# Patient Record
Sex: Female | Born: 1935 | Race: White | Hispanic: No | Marital: Married | Smoking: Never smoker
Health system: Southern US, Community
[De-identification: ages and names within clinical notes are randomized; demographics above are authoritative.]

## PROBLEM LIST (undated history)

## (undated) DIAGNOSIS — H269 Unspecified cataract: Secondary | ICD-10-CM

## (undated) DIAGNOSIS — R002 Palpitations: Secondary | ICD-10-CM

## (undated) DIAGNOSIS — K219 Gastro-esophageal reflux disease without esophagitis: Secondary | ICD-10-CM

## (undated) DIAGNOSIS — F0280 Dementia in other diseases classified elsewhere without behavioral disturbance: Secondary | ICD-10-CM

## (undated) DIAGNOSIS — R5383 Other fatigue: Secondary | ICD-10-CM

## (undated) DIAGNOSIS — N63 Unspecified lump in unspecified breast: Secondary | ICD-10-CM

## (undated) DIAGNOSIS — J309 Allergic rhinitis, unspecified: Secondary | ICD-10-CM

## (undated) DIAGNOSIS — I10 Essential (primary) hypertension: Secondary | ICD-10-CM

## (undated) DIAGNOSIS — F419 Anxiety disorder, unspecified: Secondary | ICD-10-CM

## (undated) DIAGNOSIS — M8580 Other specified disorders of bone density and structure, unspecified site: Secondary | ICD-10-CM

## (undated) DIAGNOSIS — R42 Dizziness and giddiness: Secondary | ICD-10-CM

## (undated) DIAGNOSIS — E1142 Type 2 diabetes mellitus with diabetic polyneuropathy: Secondary | ICD-10-CM

## (undated) DIAGNOSIS — M545 Low back pain, unspecified: Secondary | ICD-10-CM

## (undated) DIAGNOSIS — K589 Irritable bowel syndrome without diarrhea: Secondary | ICD-10-CM

## (undated) DIAGNOSIS — L292 Pruritus vulvae: Secondary | ICD-10-CM

## (undated) DIAGNOSIS — E7800 Pure hypercholesterolemia, unspecified: Secondary | ICD-10-CM

## (undated) DIAGNOSIS — E119 Type 2 diabetes mellitus without complications: Secondary | ICD-10-CM

## (undated) DIAGNOSIS — E782 Mixed hyperlipidemia: Secondary | ICD-10-CM

## (undated) DIAGNOSIS — M1990 Unspecified osteoarthritis, unspecified site: Secondary | ICD-10-CM

## (undated) DIAGNOSIS — R339 Retention of urine, unspecified: Secondary | ICD-10-CM

## (undated) DIAGNOSIS — G309 Alzheimer's disease, unspecified: Secondary | ICD-10-CM

## (undated) DIAGNOSIS — F410 Panic disorder [episodic paroxysmal anxiety] without agoraphobia: Secondary | ICD-10-CM

## (undated) HISTORY — DX: Mixed hyperlipidemia: E78.2

## (undated) HISTORY — DX: Essential (primary) hypertension: I10

## (undated) HISTORY — DX: Dementia in other diseases classified elsewhere, unspecified severity, without behavioral disturbance, psychotic disturbance, mood disturbance, and anxiety: F02.80

## (undated) HISTORY — DX: Other specified disorders of bone density and structure, unspecified site: M85.80

## (undated) HISTORY — DX: Retention of urine, unspecified: R33.9

## (undated) HISTORY — DX: Low back pain: M54.5

## (undated) HISTORY — DX: Unspecified lump in unspecified breast: N63.0

## (undated) HISTORY — DX: Low back pain, unspecified: M54.50

## (undated) HISTORY — DX: Palpitations: R00.2

## (undated) HISTORY — DX: Panic disorder (episodic paroxysmal anxiety): F41.0

## (undated) HISTORY — DX: Unspecified osteoarthritis, unspecified site: M19.90

## (undated) HISTORY — DX: Type 2 diabetes mellitus with diabetic polyneuropathy: E11.42

## (undated) HISTORY — DX: Gastro-esophageal reflux disease without esophagitis: K21.9

## (undated) HISTORY — DX: Pruritus vulvae: L29.2

## (undated) HISTORY — DX: Unspecified cataract: H26.9

## (undated) HISTORY — DX: Irritable bowel syndrome without diarrhea: K58.9

## (undated) HISTORY — DX: Dizziness and giddiness: R42

## (undated) HISTORY — DX: Pure hypercholesterolemia, unspecified: E78.00

## (undated) HISTORY — DX: Allergic rhinitis, unspecified: J30.9

## (undated) HISTORY — DX: Other fatigue: R53.83

## (undated) HISTORY — DX: Alzheimer's disease, unspecified: G30.9

## (undated) HISTORY — PX: TOTAL HIP ARTHROPLASTY: SHX124

## (undated) HISTORY — DX: Irritable bowel syndrome, unspecified: K58.9

## (undated) HISTORY — DX: Anxiety disorder, unspecified: F41.9

## (undated) HISTORY — DX: Type 2 diabetes mellitus without complications: E11.9

---

## 2003-08-23 ENCOUNTER — Inpatient Hospital Stay (HOSPITAL_COMMUNITY): Admission: AD | Admit: 2003-08-23 | Discharge: 2003-08-31 | Payer: Self-pay | Admitting: Cardiology

## 2009-12-29 ENCOUNTER — Inpatient Hospital Stay (HOSPITAL_COMMUNITY): Admission: AD | Admit: 2009-12-29 | Discharge: 2010-01-04 | Payer: Self-pay | Admitting: Internal Medicine

## 2010-07-27 LAB — CBC
HCT: 25.3 % — ABNORMAL LOW (ref 36.0–46.0)
HCT: 26.2 % — ABNORMAL LOW (ref 36.0–46.0)
HCT: 26.7 % — ABNORMAL LOW (ref 36.0–46.0)
Hemoglobin: 9 g/dL — ABNORMAL LOW (ref 12.0–15.0)
Hemoglobin: 9.1 g/dL — ABNORMAL LOW (ref 12.0–15.0)
MCH: 28.5 pg (ref 26.0–34.0)
MCH: 28.6 pg (ref 26.0–34.0)
MCH: 28.9 pg (ref 26.0–34.0)
MCH: 28.9 pg (ref 26.0–34.0)
MCHC: 33.3 g/dL (ref 30.0–36.0)
MCHC: 33.4 g/dL (ref 30.0–36.0)
MCV: 85.5 fL (ref 78.0–100.0)
MCV: 85.6 fL (ref 78.0–100.0)
MCV: 87.3 fL (ref 78.0–100.0)
Platelets: 149 10*3/uL — ABNORMAL LOW (ref 150–400)
Platelets: 171 10*3/uL (ref 150–400)
Platelets: 201 10*3/uL (ref 150–400)
RBC: 3 MIL/uL — ABNORMAL LOW (ref 3.87–5.11)
RBC: 3.12 MIL/uL — ABNORMAL LOW (ref 3.87–5.11)
RBC: 3.2 MIL/uL — ABNORMAL LOW (ref 3.87–5.11)
RDW: 14.2 % (ref 11.5–15.5)
RDW: 14.4 % (ref 11.5–15.5)
RDW: 14.8 % (ref 11.5–15.5)
WBC: 10.4 10*3/uL (ref 4.0–10.5)
WBC: 11.2 10*3/uL — ABNORMAL HIGH (ref 4.0–10.5)
WBC: 7.5 10*3/uL (ref 4.0–10.5)

## 2010-07-27 LAB — CROSSMATCH: Antibody Screen: NEGATIVE

## 2010-07-27 LAB — BASIC METABOLIC PANEL
BUN: 12 mg/dL (ref 6–23)
BUN: 17 mg/dL (ref 6–23)
BUN: 19 mg/dL (ref 6–23)
CO2: 25 mEq/L (ref 19–32)
CO2: 29 mEq/L (ref 19–32)
Calcium: 8 mg/dL — ABNORMAL LOW (ref 8.4–10.5)
Calcium: 8.6 mg/dL (ref 8.4–10.5)
Chloride: 100 mEq/L (ref 96–112)
Chloride: 103 mEq/L (ref 96–112)
Chloride: 105 mEq/L (ref 96–112)
Creatinine, Ser: 0.85 mg/dL (ref 0.4–1.2)
Creatinine, Ser: 0.87 mg/dL (ref 0.4–1.2)
GFR calc Af Amer: >60 mL/min (ref >60)
GFR calc non Af Amer: >60 mL/min (ref >60)
Glucose, Bld: 207 mg/dL — ABNORMAL HIGH (ref 70–99)
Glucose, Bld: 267 mg/dL — ABNORMAL HIGH (ref 70–99)
Potassium: 3.7 mEq/L (ref 3.5–5.1)
Potassium: 3.7 mEq/L (ref 3.5–5.1)
Potassium: 3.8 mEq/L (ref 3.5–5.1)
Sodium: 134 mEq/L — ABNORMAL LOW (ref 135–145)

## 2010-07-27 LAB — GLUCOSE, CAPILLARY
Glucose-Capillary: 124 mg/dL — ABNORMAL HIGH (ref 70–99)
Glucose-Capillary: 133 mg/dL — ABNORMAL HIGH (ref 70–99)
Glucose-Capillary: 150 mg/dL — ABNORMAL HIGH (ref 70–99)
Glucose-Capillary: 159 mg/dL — ABNORMAL HIGH (ref 70–99)
Glucose-Capillary: 164 mg/dL — ABNORMAL HIGH (ref 70–99)
Glucose-Capillary: 176 mg/dL — ABNORMAL HIGH (ref 70–99)
Glucose-Capillary: 184 mg/dL — ABNORMAL HIGH (ref 70–99)
Glucose-Capillary: 198 mg/dL — ABNORMAL HIGH (ref 70–99)
Glucose-Capillary: 215 mg/dL — ABNORMAL HIGH (ref 70–99)
Glucose-Capillary: 218 mg/dL — ABNORMAL HIGH (ref 70–99)
Glucose-Capillary: 219 mg/dL — ABNORMAL HIGH (ref 70–99)
Glucose-Capillary: 252 mg/dL — ABNORMAL HIGH (ref 70–99)
Glucose-Capillary: 265 mg/dL — ABNORMAL HIGH (ref 70–99)

## 2010-07-27 LAB — DIFFERENTIAL
Basophils Absolute: 0 10*3/uL (ref 0.0–0.1)
Basophils Absolute: 0 10*3/uL (ref 0.0–0.1)
Basophils Relative: 0 % (ref 0–1)
Eosinophils Absolute: 0.2 10*3/uL (ref 0.0–0.7)
Eosinophils Relative: 2 % (ref 0–5)
Lymphocytes Relative: 17 % (ref 12–46)
Lymphs Abs: 1.6 10*3/uL (ref 0.7–4.0)
Monocytes Absolute: 0.7 10*3/uL (ref 0.1–1.0)
Monocytes Relative: 6 % (ref 3–12)
Neutro Abs: 9.5 10*3/uL — ABNORMAL HIGH (ref 1.7–7.7)
Neutrophils Relative %: 76 % (ref 43–77)

## 2010-07-27 LAB — COMPREHENSIVE METABOLIC PANEL
Alkaline Phosphatase: 52 U/L (ref 39–117)
BUN: 21 mg/dL (ref 6–23)
CO2: 26 mEq/L (ref 19–32)
CO2: 26 mEq/L (ref 19–32)
Calcium: 8.5 mg/dL (ref 8.4–10.5)
Chloride: 101 mEq/L (ref 96–112)
Chloride: 101 mEq/L (ref 96–112)
Creatinine, Ser: 1.01 mg/dL (ref 0.4–1.2)
GFR calc Af Amer: >60 mL/min (ref >60)
GFR calc non Af Amer: 53 mL/min — ABNORMAL LOW (ref >60)
Glucose, Bld: 194 mg/dL — ABNORMAL HIGH (ref 70–99)
Glucose, Bld: 263 mg/dL — ABNORMAL HIGH (ref 70–99)
Potassium: 3.7 mEq/L (ref 3.5–5.1)
Total Bilirubin: 0.7 mg/dL (ref 0.3–1.2)
Total Bilirubin: 0.8 mg/dL (ref 0.3–1.2)

## 2010-07-27 LAB — URINE MICROSCOPIC-ADD ON

## 2010-07-27 LAB — URINALYSIS, MICROSCOPIC ONLY
Glucose, UA: 250 mg/dL — AB
Ketones, ur: NEGATIVE mg/dL
Leukocytes, UA: NEGATIVE
Protein, ur: 30 mg/dL — AB
Urobilinogen, UA: 0.2 mg/dL (ref 0.0–1.0)

## 2010-07-27 LAB — TSH: TSH: 0.772 u[IU]/mL (ref 0.350–4.500)

## 2010-07-27 LAB — CULTURE, BLOOD (ROUTINE X 2)

## 2010-07-27 LAB — URINALYSIS, ROUTINE W REFLEX MICROSCOPIC
Leukocytes, UA: NEGATIVE
Nitrite: NEGATIVE
Protein, ur: 30 mg/dL — AB

## 2010-07-27 LAB — HEMOGLOBIN A1C: Hgb A1c MFr Bld: 6.6 % — ABNORMAL HIGH (ref <5.7)

## 2010-07-27 LAB — PROTIME-INR: Prothrombin Time: 14.5 seconds (ref 11.6–15.2)

## 2010-07-27 LAB — ABO/RH: ABO/RH(D): A POS

## 2010-07-27 LAB — APTT: aPTT: 29 seconds (ref 24–37)

## 2010-09-28 NOTE — Consult Note (Signed)
NAME:  Gabriela Rojas, Gabriela Rojas                        ACCOUNT NO.:  [id number]   MEDICAL RECORD NO.:  [id number]                   PATIENT TYPE:  INP   LOCATION:  3703                                 FACILITY:  [facility]   PHYSICIAN:  Vania Rea. Supple, M.D.               DATE OF BIRTH:  21-Sep-1927   DATE OF CONSULTATION:  08/23/2003  DATE OF DISCHARGE:                                   CONSULTATION   HISTORY:  Gabriela Rojas is a 77 year old female who was transferred to [facility] from [facility] after falling back on April 5 and  sustaining a displaced left femoral neck fracture. Subsequent to her  admission at [facility] she was found to have complete  cardiopulmonary collapse necessitating intubation and a stay in the  intensive care unit. The possible etiologies of the cardiopulmonary collapse  included as a pulmonary embolus versus massive MI. However, workup does not  confirm the presence of PE or severe MI. Subsequently cardiac  catheterization performed at [facility] today did show some severe  stenosis of the small diagonal artery, but it was not felt that this  explained the hypotensive episode that she experienced. It was felt that she  was a reasonable surgical candidate based on the results of the  catheterization. At this point orthopedics has been consulted for  recommendations regarding management of her left hip fracture.   PAST MEDICAL HISTORY:  Gabriela Rojas' medical history if significant for  coronary artery disease, CHF, type 2 diabetes, peripheral neuropathy. I know  she does have a history of paroxysmal supraventricular tachycardias.   MEDICATIONS:  1. Lipitor.  2. Diovan.  3. Daily aspirin.  4. Glucophage.  5. Another antihypertensive medication, which she does not recall the name.   ALLERGIES:  Penicillin, ACE inhibitors, and sulfa drugs.   SOCIAL HISTORY:  Reports no tobacco or alcohol use. She is living  independently with her  husband.   PAST SURGICAL HISTORY:  Excision of a breast mass and bilateral cataracts.  She has had a D&C as well.   PHYSICAL EXAMINATION:  GENERAL:  Gabriela Rojas is a pleasant, cooperative  elderly female who is alert and oriented in no obvious acute distress. She  is cooperative with the exam. Examination limited to the left lower  extremity.  EXTREMITY:  Tenderness with palpation of the left hip and severe discomfort  with intensive hip motion. She has excellent digital motion distally in the  left lower extremity with a 2+ dorsal pedal pulse. She does describe some  diminished sensation secondary to underlying neuropathy and this is a  bilateral finding. Upper extremities reveal no acute bone or joint  instabilities.   RADIOGRAPHS:  No plain films are available, but these have been ordered. By  report she does have a displaced left femoral neck fracture.   IMPRESSION:  1. Reported left displaced femoral neck fracture.  2. Recent cardiopulmonary  collapse.  3. Type 2 diabetes.  4. Neuropathy.  5. History of supraventricular tachycardia.  6. Hypertension.   PLAN:  I have discussed with Ms. Biddy today's findings and treatment  options for the management of her left hip fracture. We have discussed the  technical details of hemiarthroplasty and risks versus benefits, possible  complications including neurovascular injury, leg-limb discrepancy,  dislocation of the implant, persistent pain, and possibly revision surgery  were reviewed. In addition, we have also discussed her potential increased  risk for  perioperative morbidity and/or mortality in relation to her  underlying problems. At this point we will await formal medical clearance  prior to surgery. Should appropriate clearances be available I would  anticipate surgery later this week. We have also discussed the anticipated  postoperative recovery  period. We will follow up tomorrow to confirm  medical clearance and make  formal arrangements for surgery.                                               Vania Rea. Supple, M.D.    KMS/MEDQ  D:  08/23/2003  T:  08/24/2003  Job:  [id number]

## 2010-09-28 NOTE — Cardiovascular Report (Signed)
NAME:  Gabriela Rojas, Gabriela Rojas                        ACCOUNT NO.:  [id number]   MEDICAL RECORD NO.:  [id number]                   PATIENT TYPE:  INP   LOCATION:  3703                                 FACILITY:  [facility]   PHYSICIAN:  Salvadore Farber, M.D. [facility]         DATE OF BIRTH:  1928/01/20   DATE OF PROCEDURE:  DATE OF DISCHARGE:                              CARDIAC CATHETERIZATION   PROCEDURE:  Left-heart catheterization, left ventriculography, coronary  angiography.   INDICATION:  [name] is a 77 year old lady without prior history of  cardiovascular disease but multiple cardiac risk factors.  She was admitted  to [facility] on April 5, after sustaining a fall and fracture of  the left hip.  On April 7, she had an episode of fever and profound  hypotension.  She was thereafter found to have a modest increase in her  cardiac enzymes.  Evaluation for pulmonary embolus with spiral CT was  unremarkable.  She has subsequently recovered.  She is now referred for  diagnostic angiography to exclude severe, underlying coronary disease as the  potential explanation for this event, and to provide risk stratification for  potential repair of her hip.   PROCEDURAL TECHNIQUE:  Informed consent was obtained.  Under 1% lidocaine  local anesthesia, a 6-French sheath was placed in the right common femoral  artery using the modified Seldinger technique.  Diagnostic angiography and  ventriculography were performed using JL-4, JR-4, and pigtail catheters.  The patient tolerated the procedure well, and was transferred to the holding  room in stable condition.  She has already moved there.   COMPLICATIONS:  None.   FINDINGS:  1. LV:  146/14/21.  Ejection fraction 65% without regional wall motion     abnormality. No aortic stenosis or mitral regurgitation.  2. LEFT MAIN:  Angiographically normal.  3. LAD.  The LAD is a moderate-sized vessel giving rise to a large first and     very-small  second diagonal.  There is a 60% stenosis of the mid portion     of the LAD.  The second diagonal has a 90% ostial stenosis. Again, this     is a very-small vessel.  4. CIRCUMFLEX:  A moderate-sized vessel giving rise to a single obtuse     marginal.  There are only minor luminal irregularities along its course.  5. RCA:  A large, dominant vessel.  There is a 20% stenosis of the proximal     vessel.   IMPRESSION/RECOMMENDATIONS:  1. Single-vessel coronary artery disease involving the LAD.  There is 60%     stenosis of the LAD proper and 90% stenosis of the ostium of a small     diagonal.  The diagonal stenosis may explain the cardiac     enzymes__________that she suffered earlier in hospitalization.  However,     it would not readily explain the     hypotension that accompanied it.  2. Given this  coronary anatomy, she should be at low risk for major     perioperative cardiac complications.  She will increase her beta-blocker     for perioperative cardiac protection.                                               Salvadore Farber, M.D. [facility]    WED/MEDQ  D:  08/23/2003  T:  08/24/2003  Job:  [id number]   cc:   Sidonie Dickens, M.D. [facility]

## 2010-09-28 NOTE — Op Note (Signed)
NAME:  Gabriela Rojas, Gabriela Rojas                        ACCOUNT NO.:  [id number]   MEDICAL RECORD NO.:  [id number]                   PATIENT TYPE:  INP   LOCATION:  3703                                 FACILITY:  [facility]   PHYSICIAN:  Vania Rea. Supple, M.D.               DATE OF BIRTH:  [birth year]   DATE OF PROCEDURE:  08/25/2003  DATE OF DISCHARGE:                                 OPERATIVE REPORT   PREOPERATIVE DIAGNOSIS:  Displaced left femoral neck fracture.   POSTOPERATIVE DIAGNOSIS:  Displaced left femoral neck fracture.   PROCEDURE:  Cemented left hip hemiarthroplasty utilizing an [employer] HFX  femoral stem size 6, plus 0 neck length, and a 48 mm unipolar head.   SURGEON:  Vania Rea. Supple, M.D.   ASSISTANTSherryl Manges, P.A.-C.   ANESTHESIA:  General endotracheal anesthesia.   ESTIMATED BLOOD LOSS:  200 mL.   DRAINS:  None.   HISTORY:  Gabriela Rojas is a 77 year old female who fell over a week ago  sustaining a left femoral neck fracture.  She was admitted to [facility] and just prior to surgery developed severe cardiopulmonary  collapse.  The exact etiology was unclear, although the differential  included a massive MI versus a pulmonary embolus.  She was subsequently  transferred to the [facility] for cardiac catheterization and  further workup.  Catheterization showed some mild stenosis but no evidence  for significant myocardial damage.  Subsequent pulmonary arteriogram  confirmed pulmonary emboli in the right upper and lower lobes.  Subsequently, an inferior vena cava filter has been placed and Gabriela Rojas  has now been cleared for surgery for treatment of her left femoral neck  fracture.  Preoperatively, I discussed with Gabriela Rojas and her family  members the treatment options as well as the risks versus benefits thereof.  The possible surgical complications of bleeding, infection, neurovascular  injury, DVT, PE, leg length discrepancy, pain, instability of  the implant,  possible need for additional surgery were all reviewed.  They understand,  accept, and agreed to planned procedure.   PROCEDURE IN DETAIL:  After undergoing routine preoperative evaluation, the  patient received prophylactic antibiotics.  The patient was placed supine on  the operating table and underwent the smooth induction of general  endotracheal anesthesia.  She was turned to the right lateral decubitus  position and appropriately padded and protected.  The left hip region was  sterilely prepped and draped in a standard fashion.  A posterior approach to  the left hip was made through a 15 cm incision centered over the greater  trochanter.  Skin flaps were elevated and electrocautery was used for  hemostasis.  The deep fascia was divided in line with the skin incision and  the gluteus maximus was split along the course of its fibers proximally in  the wound.  The Charnley retractor was then placed.  The short external  rotators  were then divided away from the greater trochanter with  electrocautery and these were then tagged with #1 Ethibond sutures.  The  capsule was also T'd and tagged with #1 Ethibond sutures.  The fracture site  was readily visible.  The humeral head was removed with a corkscrew.  The  acetabulum was cleaned.  The head was measured at approximately 47 to 48 mm  and these were both trialed and the 48 had the better fit.  Attention was  turned to the proximal femur at the fracture site.  The intramedullary canal  was entered with a guide pin followed by a step drill and then sequential  reamings were performed up to a size 6.  Sequential broachings were  performed up to a size 6.  We did perform an osteotomy of the femoral neck  one fingerbreadth above the lesser trochanter utilizing the neck cutting  guide for proper alignment.  The final broach was appropriately seated.  This was then removed and a distal cement plug was placed.  The canal was   pulsatile lavaged.  The cement was mixed and at the appropriate consistency,  was introduced into the canal in a retrograde fashion and then pressurized  and the implant was placed into position maintaining proper degree of  anteversion.  Once the cement was hardened, all residual cement was  meticulously removed.  The acetabulum was cleaned.  Trial reduction was  performed and the +0 neck length had the best soft tissue balance with good  hip motion and excellent stability.  The trial was then removed.  The final  head and neck were then placed into position after the trunnion was  meticulously cleaned.  Final reduction was performed.  The hip was taken  through a range of motion showing good stability and clinically equal leg  lengths.  The wound was terminally irrigated.  Hemostasis was obtained.  The  deep fascia was closed with a series of interrupted figure-of-eight #1  Vicryl sutures, 2-0 Vicryl for the subcu, and intracuticular 3-0 Monocryl  for the skin.  Steri-Strips were applied followed by a dry dressing.  The  patient was then rolled supine, extubated, and taken to the recovery room in  stable condition.                                               Vania Rea. Supple, M.D.    KMS/MEDQ  D:  08/25/2003  T:  08/26/2003  Job:  [id number]

## 2010-09-28 NOTE — Discharge Summary (Signed)
NAME:  Gabriela Rojas, Gabriela Rojas                        ACCOUNT NO.:  [id number]   MEDICAL RECORD NO.:  [id number]                   PATIENT TYPE:  INP   LOCATION:  3703                                 FACILITY:  [facility]   PHYSICIAN:  [place] Bing, M.D.               DATE OF BIRTH:  1928/04/05   DATE OF ADMISSION:  08/23/2003  DATE OF DISCHARGE:  08/31/2003                                 DISCHARGE SUMMARY   HISTORY OF PRESENT ILLNESS:  The patient is a 77 year old female who is  admitted to [facility] on August 16, 2003, following a fall and  sustaining a subsequent left femoral neck fracture while ambulating to her  mailbox. There was no frank syncope involved.  She attributes it to her  history of diabetic neuropathy and she lost her balance.  After evaluation  of surgery, she was planned for orthopedic surgery.  Cardiac consultation  was obtained at [facility] on August 18, 2003, because of development  of a fever and sinus tachycardia associated with hypoxia and hypotension.  Blood work at [facility] showed abnormal CK-MB's and troponins, but  an EKG continued to show nonspecific ST T wave changes.  After review by Dr.  Kriste Basque, it was felt that he would obtain an echocardiogram and a CT of her  chest and consider cardiac catheterization.   PAST MEDICAL HISTORY:  Notable for diabetes, hypertension, dyslipidemia,  SVT, Adenosine Cardiolite in March of 2002 which showed an EF of 61% and no  ischemia.  Gastroenteritis and cystitis in the past.  Breast mass excision.   LABORATORY DATA:  At [facility] chest CT on April 14, showed right  lower and upper lobe pulmonary emboli, small to moderate size bilateral  pleural effusions with compression and bibasilar atelectasis, cardiomegaly,  borderline size mediastinal lymph nodes, mildly enlarged right hilar lymph  node.   Complete hip x-ray showed displaced left femoral neck fracture, post surgery  showed status post left  hip arthroplasty without apparent complications.  On  April 17, again it showed prosthetic and appropriate position, no evidence  of fracture dislocation.   At [facility], first H&H was obtained on April 13, and this showed  11.1 and 32.6, normal indices, platelets 296, WBC 7.3.  Chemistry obtained  on April 12 showed sodium 137, potassium 3.6, BUN 6, creatinine 0.7, and  glucose 120.  Subsequent hematologies were essentially unremarkable.  There  was a slight decline in her H&H and on April 19 it was 9.1 and 27.3, normal  indices, platelets 385, WBC's 11.0.  Also on April 19, sodium 139, potassium  3.5, BUN 5, creatinine 0.7, glucose 17.  At the time of discharge on April  20, her INR was 2.4 with a PT of 21.   HOSPITAL COURSE:  The patient was transferred to [facility] on  August 23, 2003, for cardiac catheterization.  This was performed on August 23, 2003, by Dr. Samule Ohm.  According to his progress notes, she has a 90%  proximal diagonal 2, 60% mid LAD, 20% proximal RCA. EF was 65% without wall  motion abnormalities.  Dr. Samule Ohm noted a severe stenosis of a small  diagonal.  He felt that this may explain her enzyme leak, but would not  explain her hypotension. Her beta blocker was increased for perioperative  cardiac protection.  It was felt that she should be low risk for major  cardiac complications, however, it was imperative that the beta blocker be  given perioperatively, IV if necessary.  Dr. __________ was consulted.  Post  catheterization, the catheterization site was intact.  Dr. Rennis Chris saw the  patient for orthopedic consultation. He reviewed x-rays, obtained new x-  rays, and agreed with planned surgery.  Her low potassium was replaced.  Dr.  Samule Ohm saw the patient again on April 13, and felt that there was no clear  explanation as of her collapse.  He felt that the biggest concern as of now  was her pulmonary embolism.  CT was performed confirming pulmonary  embolism.  She was placed on IV heparin on April 13 and this was continually regulated  by pharmacy.  On April 14, with her pulmonary embolism pulmonary consult was  obtained.  Pulmonary did not recommend surgery at this time, but an IVC  filter should be placed and reschedule her orthopedic surgery.  Venous  Dopplers were performed and did not show any evidence of a DVT.  IVC filter  was placed by Dr. Chales Abrahams on August 25, 2003, via the right femoral vein  without difficulty.  On April 14, that afternoon, she underwent orthopedic  surgery without difficulty.  Coumadin was started on April 15 at 5 mg.  She  did receive Coumadin teaching and she continued on 5 mg daily, however, it  is noted that on April 19, she received 3 mg.  Coumadin was regulated by  pharmacy.  She began receiving physical therapy for orthopedics.  By April  17, orthopedics felt that she was doing well, her activity had gradually  increased.  Pulmonary signed off the patient on April 18, and recommended  continued rehab from orthopedic standpoint.  Dr. Chales Abrahams added a baby aspirin  on April 18, and changed her Lasix to p.o.  On April 18, case management  began assisting with discharge planning and by April 20, case management was  able to obtain a bed in [place], [place], to [facility] under the  care of Dr. Doyne Keel.  By April 20, paperwork was completed, thus her  transfer.   DISCHARGE DIAGNOSES:  1. Fall with left femoral neck fracture requiring surgery.  2. Pulmonary embolism.  3. Non-Q myocardial infarction.  4. Coronary artery disease, status post IVC filter placement.  5. Medical treatment for coronary artery disease.   DISPOSITION:  She is discharged to [facility].   DISCHARGE MEDICATIONS:  New medications;  1. Coumadin 5 mg 1/2 tablet daily to maintain a target of 2 to 3.  It is     recommended that she have a PT INR drawn on Friday as well as a CBC.  It    is also suggested that they continue to  check stools for blood.  2. Baby aspirin 81 mg daily.  3. Ensure chocolate daily.  4. Colace 100 mg b.i.d.  5. Ferrous sulfate 325 mg b.i.d.  6. Lasix 20 mg daily for three days.  7. K-Dur 40 mEq  daily for three days.  8. Toprol XL 100 mg daily.   She was given permission to continue her Protonix 40 mg daily, Lipitor 10 mg  q.h.s., Glucophage 500 mg b.i.d.  She was told not to take her Diovan,  Inderal, and Imipramine.   Her pain management, activities, and wound care will be directed per  surgery.  She was asked to maintain low salt, fat, cholesterol, ADA diet.  Once she is discharged from [facility], the facility where the patient's  family is  asked to make a follow-up appointment with Dr. Diona Browner in the [facility],  phone numbers were provided.  She was also asked to follow up with surgery  as directed by Dr. Rennis Chris.  It is noted that she will need to be entered  into the Coumadin Clinic with either Dr. Diona Browner or Dr. Doyne Keel to maintain  a target INR of 2 to 3.      Joellyn Rued, P.A. [facility]                    Glenvil Bing, M.D.    EW/MEDQ  D:  08/31/2003  T:  08/31/2003  Job:  [id number]   cc:   Sidonie Dickens, M.D. [facility]   Vania Rea. Supple, M.D.  [facility]  [address]  [address]  [place]  [place] [address]  Fax: [phone]

## 2010-09-28 NOTE — Op Note (Signed)
NAME:  Gabriela Rojas, Gabriela Rojas                        ACCOUNT NO.:  [id number]   MEDICAL RECORD NO.:  [id number]                   PATIENT TYPE:  INP   LOCATION:  3703                                 FACILITY:  [facility]   PHYSICIAN:  Veneda Melter, M.D.                   DATE OF BIRTH:  November 08, 1927   DATE OF PROCEDURE:  08/25/2003  DATE OF DISCHARGE:                                 OPERATIVE REPORT   PROCEDURE:  Placement of IVC filter.   PREOPERATIVE DIAGNOSIS:  Pulmonary embolism.  Left hip fracture.  Coronary  artery disease.   INDICATIONS FOR PROCEDURE:  The patient is a 77 year old female who presents  with cardiopulmonary collapse and was hospitalized at [facility].  She had sustained a left femoral neck fracture that required surgical  intervention.  Further workup showed evidence of bilateral massive pulmonary  embolism on spiral CT with cardiac catheterization showing moderate coronary  artery disease that will be medically managed.  Echocardiogram suggested  some RV strain.  It was felt that the patient would benefit from IVC filter  placement.  As further event would catastrophic.   DESCRIPTION OF PROCEDURE:  Informed consent was obtained.  The patient  brought to the catheterization lab.  A 7 French sheath was positioned in the  right femoral vein using modified Seldinger technique.  Venography was then  performed of the IVC using power injections of contrast and the renal veins  and appropriate landmarks identified.  A trapeze filter was then deployed  below the renal veins and was seen to oppose the vessel wall well with  repeat venography showing full apposition and no evidence of damage to the  IVC.  The sheath was then retracted and manual pressure applied until  adequate hemostasis achieved.  The patient tolerated the procedure well and  was transferred to the floor in stable condition.   RESULTS:  Successful percutaneous placement of a Trapeze IVC filter in the  inferior vena cava.                                               Veneda Melter, M.D.    NG/MEDQ  D:  08/25/2003  T:  08/26/2003  Job:  [id number]   cc:   Pricilla Riffle, M.D.

## 2010-09-28 NOTE — Consult Note (Signed)
NAME:  NAOKO, DIPERNA                        ACCOUNT NO.:  [id number]   MEDICAL RECORD NO.:  [id number]                   PATIENT TYPE:  INP   LOCATION:  3703                                 FACILITY:  [facility]   PHYSICIAN:  Shan Levans, M.D. [facility]            DATE OF BIRTH:  10-19-1927   DATE OF CONSULTATION:  DATE OF DISCHARGE:                                   CONSULTATION   Audio too short to transcribe (less than 5 seconds)                                               Shan Levans, M.D. [facility]    PW/MEDQ  D:  08/25/2003  T:  08/25/2003  Job:  [id number]

## 2010-09-28 NOTE — Consult Note (Signed)
NAME:  Gabriela Rojas, Gabriela Rojas                        ACCOUNT NO.:  [id number]   MEDICAL RECORD NO.:  [id number]                   PATIENT TYPE:  INP   LOCATION:  3703                                 FACILITY:  [facility]   PHYSICIAN:  Shan Levans, M.D. [facility]            DATE OF BIRTH:  1927-08-14   DATE OF CONSULTATION:  08/25/2003  DATE OF DISCHARGE:                                   CONSULTATION   CHIEF COMPLAINT:  Pulmonary embolism.   HISTORY OF PRESENT ILLNESS:  This 77 year old white female was admitted on  August 16, 2003 to [facility] following a hip fracture of the left  femoral neck. She fell while ambulating to her mailbox. No syncope. Previous  history of diabetic neuropathy. Lost her balance. Was planning to have  surgery the following morning but found to have fever of 101, sinus  tachycardia and became hypoxic and hypotensive. She was given volume but  required intubation, mechanical ventilation ultimately. Failed to be weaned  from mechanical ventilation and extubated and transferred down to [facility] on August 23, 2003 for further cardiac evaluation prior to hip  surgery. Cardiac catheterization showed some lesions in the left anterior  descending but none in the right coronary. She has right ventricular strain  noted. Initial CT of the chest done at [facility] was not revealing for  abnormalities. However, followup CT scan done on August 24, 2003 shows  pulmonary emboli in the right upper and lower lung zone and air space  disease at the right base. She is on a heparin drip and has been on and off  the heparin drip since last week. We are asked to evaluate but she has  planned to have hip surgery this afternoon. Superior vena cava filter is in  place. The patient denies any chest pain or cough. She is on room air at  this time.   PAST MEDICAL HISTORY:  Type 2 diabetes, hypertension, dyslipidemia, diabetic  neuropathy, SVT. No previous history of myocardial  infarction. History of  gastroenteritis. Previous breast mass excision.   SOCIAL HISTORY:  No smoking or alcohol use. Married and lived with her  husband at home.   REVIEW OF SYSTEMS:  Otherwise noncontributory.   ADMISSION MEDICATIONS:  Diovan, Protonix, Ativan, imipramine, Lipitor,  Glucophage, Duragesic. Currently being placed, Inderal, Percocet.   ALLERGIES:  PENICILLIN AND SULFA.   CURRENT MEDICATIONS:  Lantus 5 units a.m., heparin drip, aspirin 325 mg  daily, Lasix 20 mg b.i.d., Ativan 1 mg t.i.d., sliding scale insulin,  Protonix 40 mg IV daily, Lopressor 100 mg t.i.d., Claforan 1 gram q. 8  hours.   PHYSICAL EXAMINATION:  VITAL SIGNS:  T max 101, blood pressure 138/84, pulse  83, respiratory rate 20, saturation 91% on room air.  CHEST:  Showed clear except for decreased breath sounds at the right base.  CARDIAC:  Examination showed resting tachycardia without S3. Normal S1 and  S2.  ABDOMEN:  Soft, nontender.  EXTREMITIES:  No edema or clubbing.  SKIN:  Clear.  NEUROLOGIC:  Examination was intact.  HEENT:  Examination showed no jugular venous distention or lymphadenopathy  Oropharynx clear.  NECK:  Supple.   LABORATORY DATA:  CT scan of the chest shows pulmonary emboli in both the  right upper and lower lung zone. One clot is particularly large.   Echocardiogram previously had been performed. Showed right ventricular  strain. Mild left ventricular hypertrophy. Ejection fraction of 60%. Right  ventricular enlargement. Decreased contraction. Elevated right ventricular  pressures.   Sodium 137, potassium 3.6, chloride 99, CO2 32, BUN 6, creatinine 0.7, blood  sugar 120, calcium 8.8.   IMPRESSION:  Large clot burden with pulmonary emboli, associated previous  cardiopulmonary arrest with shock in a patient who is status post left hip  fracture.   RECOMMENDATIONS:  Continue heparin drip. Place empiric vena cava filter to  protect the patient perioperatively, then  pursue left hip surgery, after  inferior vena cava filter is placed, then resume the heparin and then later,  place on Coumadin for lift. Will follow with you. Surgery, for today, has  been cancelled until the inferior vena cava filter can be placed.                                               Shan Levans, M.D. [facility]    PW/MEDQ  D:  08/25/2003  T:  08/25/2003  Job:  [id number]   cc:   [place]ron Bing, M.D.   Pricilla Riffle, M.D.   Vania Rea. Supple, M.D.  [facility]  [address]  [address]  [place]  [place] [address]  Fax: [phone]   Forrest Moron

## 2011-02-04 IMAGING — CR DG PORTABLE PELVIS
1 series · 1 of 1 positions shown · non-contrast
Comparison: 08/28/2003.

CLINICAL DATA: 82-year-old female status post ORIF left hip.

PORTABLE PELVIS

[series 1]
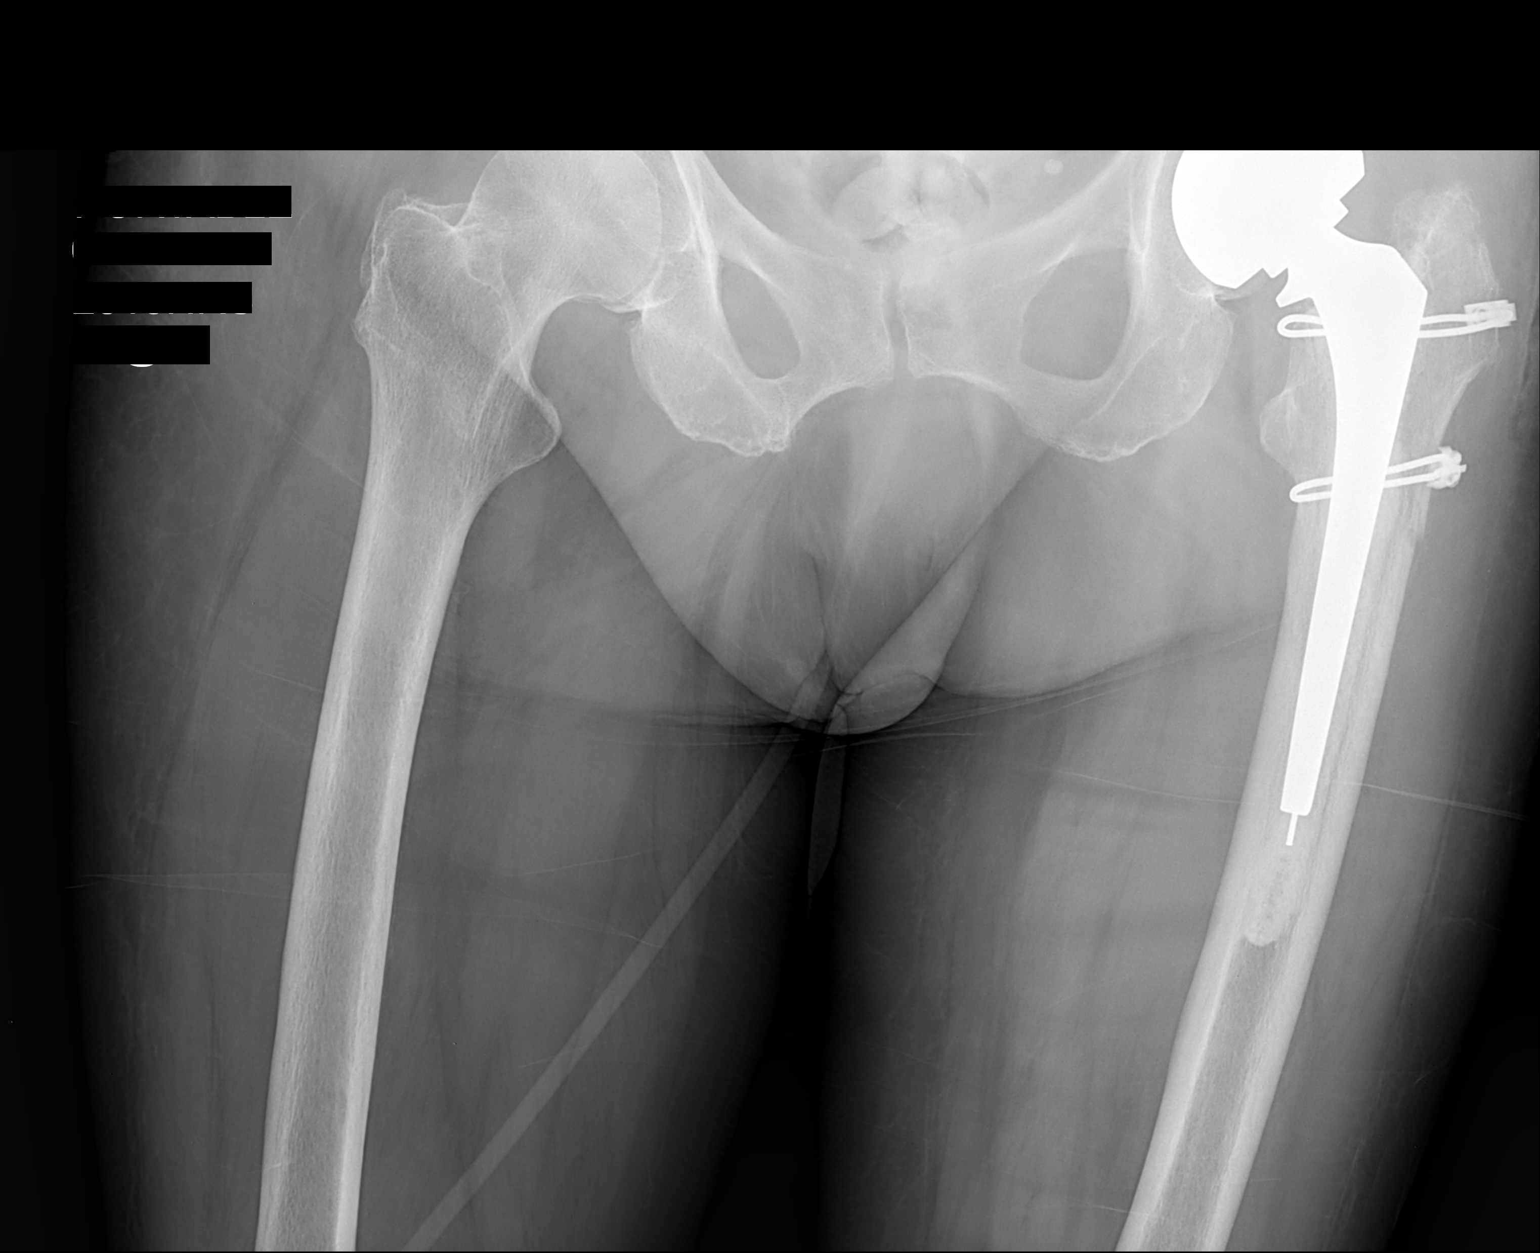

[1 of 1 positions shown; findings below may reference images not displayed]

FINDINGS: Portable AP view at 2037 hours.  Left femoral
arthroplasty revision.  Addition of cerclage wires.  Alignment at
the left hip appears within normal limits on this single view.
Hardware appears intact.
IMPRESSION: Postoperative changes to the left hip with no adverse hardware
features.

## 2011-11-28 ENCOUNTER — Encounter (INDEPENDENT_AMBULATORY_CARE_PROVIDER_SITE_OTHER): Payer: Self-pay | Admitting: *Deleted

## 2016-11-15 ENCOUNTER — Ambulatory Visit (INDEPENDENT_AMBULATORY_CARE_PROVIDER_SITE_OTHER): Payer: Medicare Other | Admitting: Cardiology

## 2016-11-15 ENCOUNTER — Encounter: Payer: Self-pay | Admitting: *Deleted

## 2016-11-15 ENCOUNTER — Encounter: Payer: Self-pay | Admitting: Cardiology

## 2016-11-15 VITALS — BP 152/81 | HR 66 | Ht 64.0 in | Wt 144.0 lb

## 2016-11-15 DIAGNOSIS — R002 Palpitations: Secondary | ICD-10-CM | POA: Diagnosis not present

## 2016-11-15 NOTE — Progress Notes (Signed)
Clinical Summary Gabriela Rojas is a 83 y.o.female referred by Dr Sherril CroonVyas as new consult for palpitations, referred by Dr Sherril CroonVyas  1. Palpitations - started about 3-4 months ago. Feeling of heart pounding, "heart flying". Lasts about 1-2 minutes. Occurs usually at night, 3-4 times a week. +SOB. Uncomfortable feeling in chest - no coffee, tea x 1 glass, MTN dew decaf, no energy drinks, no alcohol.    Past Medical History:  Diagnosis Date  . Allergic rhinitis   . Alzheimer disease   . Anxiety disorder   . Breast mass   . Cataract   . Diabetes mellitus (HCC)   . Fatigue   . GERD (gastroesophageal reflux disease)   . Heart palpitations   . HTN (hypertension)   . Irritable bowel syndrome   . Lumbago   . Mixed hyperlipidemia   . Osteoarthritis   . Osteopenia   . Panic disorder   . Polyneuropathy in diabetes (HCC)   . Pruritus of vulva   . Pure hypercholesterolemia   . Urinary retention   . Vertigo      Allergies  Allergen Reactions  . Fosamax [Alendronate Sodium] Nausea Only  . Gabapentin Other (See Comments)    DIZZINESS  . Lisinopril Other (See Comments)    ANGIOEDEMA  . Vicodin [Hydrocodone-Acetaminophen] Nausea Only  . Bactrim [Sulfamethoxazole-Trimethoprim] Rash  . Penicillins Rash     Current Outpatient Prescriptions  Medication Sig Dispense Refill  . aspirin EC 81 MG tablet Take 81 mg by mouth daily.    Marland Kitchen. docusate sodium (COLACE) 100 MG capsule Take 100 mg by mouth 2 (two) times daily.    . DULoxetine (CYMBALTA) 60 MG capsule Take 60 mg by mouth daily.    Marland Kitchen. LORazepam (ATIVAN) 1 MG tablet Take 1 mg by mouth every 8 (eight) hours.    . metFORMIN (GLUCOPHAGE) 500 MG tablet Take by mouth 2 (two) times daily with a meal.    . metoprolol tartrate (LOPRESSOR) 50 MG tablet Take 75 mg by mouth daily.    . pantoprazole (PROTONIX) 40 MG tablet Take 40 mg by mouth daily.     No current facility-administered medications for this visit.      No past surgical history on  file.   Allergies  Allergen Reactions  . Fosamax [Alendronate Sodium] Nausea Only  . Gabapentin Other (See Comments)    DIZZINESS  . Lisinopril Other (See Comments)    ANGIOEDEMA  . Vicodin [Hydrocodone-Acetaminophen] Nausea Only  . Bactrim [Sulfamethoxazole-Trimethoprim] Rash  . Penicillins Rash      Family History  Problem Relation Age of Onset  . Colon cancer Mother   . Heart attack Father      Social History Gabriela Rojas has no tobacco history on file. Gabriela Rojas has no alcohol history on file.   Review of Systems CONSTITUTIONAL: No weight loss, fever, chills, weakness or fatigue.  HEENT: Eyes: No visual loss, blurred vision, double vision or yellow sclerae.No hearing loss, sneezing, congestion, runny nose or sore throat.  SKIN: No rash or itching.  CARDIOVASCULAR: per hpi RESPIRATORY: No shortness of breath, cough or sputum.  GASTROINTESTINAL: No anorexia, nausea, vomiting or diarrhea. No abdominal pain or blood.  GENITOURINARY: No burning on urination, no polyuria NEUROLOGICAL: No headache, dizziness, syncope, paralysis, ataxia, numbness or tingling in the extremities. No change in bowel or bladder control.  MUSCULOSKELETAL: No muscle, back pain, joint pain or stiffness.  LYMPHATICS: No enlarged nodes. No history of splenectomy.  PSYCHIATRIC: No history of  depression or anxiety.  ENDOCRINOLOGIC: No reports of sweating, cold or heat intolerance. No polyuria or polydipsia.  Marland Kitchen   Physical Examination Vitals:   11/15/16 1337 11/15/16 1339  BP: (!) 161/80 (!) 152/81  Pulse: 68 66   Vitals:   11/15/16 1337  Weight: 144 lb (65.3 kg)  Height: 5\' 4"  (1.626 m)    Gen: resting comfortably, no acute distress HEENT: no scleral icterus, pupils equal round and reactive, no palptable cervical adenopathy,  CV: RRR, no m/r/g, no jvd Resp: Clear to auscultation bilaterally GI: abdomen is soft, non-tender, non-distended, normal bowel sounds, no hepatosplenomegaly MSK:  extremities are warm, no edema.  Skin: warm, no rash Neuro:  no focal deficits Psych: appropriate affect      Assessment and Plan  1. Palpitations - we will obtain 7 day event monitor - request labs from pcp, if does not include TSH will need to order next visit   F/u 6 weeks.       Antoine Poche, M.D., F.A.C.C.

## 2016-11-15 NOTE — Patient Instructions (Signed)
Your physician recommends that you schedule a follow-up appointment in: 6 WEEKS WITH DR BRANCH  Your physician recommends that you continue on your current medications as directed. Please refer to the Current Medication list given to you today.  Your physician has recommended that you wear an event monitor FOR 7 DAYS. Event monitors are medical devices that record the heart's electrical activity. Doctors most often us these monitors to diagnose arrhythmias. Arrhythmias are problems with the speed or rhythm of the heartbeat. The monitor is a small, portable device. You can wear one while you do your normal daily activities. This is usually used to diagnose what is causing palpitations/syncope (passing out).  Thank you for choosing [facility]!!     

## 2016-11-20 ENCOUNTER — Telehealth: Payer: Self-pay | Admitting: Cardiology

## 2016-11-20 NOTE — Telephone Encounter (Signed)
Attempted to return call - line was busy.

## 2016-11-20 NOTE — Telephone Encounter (Signed)
Mr. [name] called stating that patient went to see her PCP  Dr. Sherril CroonVyas on 11-19-16. Dr. Sherril CroonVyas told the patient That she did not need to wear a holter montior.  Please call Mr. [name] after 12 noon.

## 2016-11-21 NOTE — Telephone Encounter (Signed)
Spoke with husband Fayrene Fearing(James).  Stated the next day after the OV with Dr. Wyline MoodBranch, she had wellness visit with Dr. Sherril CroonVyas.  Stated he told her at that time that she did not need to wear a monitor, but did not give a reason why.  Nurse will contact Dr. Sherril CroonVyas office for copy of that OV note for clarification.

## 2016-11-22 NOTE — Telephone Encounter (Signed)
Notes received from Dr. Sherril CroonVyas office.  No mention about monitor in that note.  Call placed to Dr. Sherril CroonVyas office - Inetta Fermoina checked with Dr. Sherril CroonVyas & he did tell the patient that he wanted her to wear the monitor.  The patient is the one that is not wanting to wear the monitor.    Left message for husband Fayrene Fearing(James) to return call to discuss above.

## 2016-11-25 ENCOUNTER — Telehealth: Payer: Self-pay | Admitting: Cardiology

## 2016-11-25 NOTE — Telephone Encounter (Signed)
Left message on vm that below info will be sent to Dr. Wyline MoodBranch.

## 2016-11-25 NOTE — Telephone Encounter (Signed)
Slaughter, Vicky T 5 hours ago (11:03 AM)    Mr. [name] called stating that Joycelyn Dasdellis is refusing to wear the heart monitor.    Documentation

## 2016-11-25 NOTE — Telephone Encounter (Signed)
Mr. [name] called stating that Gabriela Rojas is refusing to wear the heart monitor.

## 2016-11-26 NOTE — Telephone Encounter (Signed)
Patient made aware the monitor has been cancelled

## 2016-11-26 NOTE — Telephone Encounter (Signed)
Patient and [employer] informed.

## 2016-11-26 NOTE — Telephone Encounter (Signed)
Patient called to check status of monitor cancellation.

## 2016-11-26 NOTE — Telephone Encounter (Signed)
Notice received that patient does not want to wear monitor, we can discuss at our follow up.   Dominga FerryJ Treon Kehl MD

## 2016-12-11 DEATH — deceased

## 2017-01-03 ENCOUNTER — Ambulatory Visit: Payer: Medicare Other | Admitting: Cardiology

## 2017-01-03 NOTE — Progress Notes (Deleted)
Clinical Summary Ms. Hermans is a 83 y.o.female  1. Palpitations - started about 3-4 months ago. Feeling of heart pounding, "heart flying". Lasts about 1-2 minutes. Occurs usually at night, 3-4 times a week. +SOB. Uncomfortable feeling in chest - no coffee, tea x 1 glass, MTN dew decaf, no energy drinks, no alcohol.   - did not want to wear monitor Past Medical History:  Diagnosis Date  . Allergic rhinitis   . Alzheimer disease   . Anxiety disorder   . Breast mass   . Cataract   . Diabetes mellitus (HCC)   . Fatigue   . GERD (gastroesophageal reflux disease)   . Heart palpitations   . HTN (hypertension)   . Irritable bowel syndrome   . Lumbago   . Mixed hyperlipidemia   . Osteoarthritis   . Osteopenia   . Panic disorder   . Polyneuropathy in diabetes (HCC)   . Pruritus of vulva   . Pure hypercholesterolemia   . Urinary retention   . Vertigo      Allergies  Allergen Reactions  . Fosamax [Alendronate Sodium] Nausea Only  . Gabapentin Other (See Comments)    DIZZINESS  . Lisinopril Other (See Comments)    ANGIOEDEMA  . Vicodin [Hydrocodone-Acetaminophen] Nausea Only  . Bactrim [Sulfamethoxazole-Trimethoprim] Rash  . Penicillins Rash     Current Outpatient Prescriptions  Medication Sig Dispense Refill  . aspirin EC 81 MG tablet Take 81 mg by mouth daily.    Marland Kitchen docusate sodium (COLACE) 100 MG capsule Take 100 mg by mouth 2 (two) times daily.    . DULoxetine (CYMBALTA) 60 MG capsule Take 60 mg by mouth daily.    Marland Kitchen LORazepam (ATIVAN) 1 MG tablet Take 1 mg by mouth every 8 (eight) hours.    . metFORMIN (GLUCOPHAGE) 500 MG tablet Take by mouth 2 (two) times daily with a meal.    . metoprolol tartrate (LOPRESSOR) 50 MG tablet Take 75 mg by mouth daily.    . pantoprazole (PROTONIX) 40 MG tablet Take 40 mg by mouth daily.     No current facility-administered medications for this visit.      Past Surgical History:  Procedure Laterality Date  . TOTAL HIP  ARTHROPLASTY       Allergies  Allergen Reactions  . Fosamax [Alendronate Sodium] Nausea Only  . Gabapentin Other (See Comments)    DIZZINESS  . Lisinopril Other (See Comments)    ANGIOEDEMA  . Vicodin [Hydrocodone-Acetaminophen] Nausea Only  . Bactrim [Sulfamethoxazole-Trimethoprim] Rash  . Penicillins Rash      Family History  Problem Relation Age of Onset  . Colon cancer Mother   . Heart attack Father      Social History Ms. Carias reports that she has never smoked. She has never used smokeless tobacco. Ms. Rieger has no alcohol history on file.   Review of Systems CONSTITUTIONAL: No weight loss, fever, chills, weakness or fatigue.  HEENT: Eyes: No visual loss, blurred vision, double vision or yellow sclerae.No hearing loss, sneezing, congestion, runny nose or sore throat.  SKIN: No rash or itching.  CARDIOVASCULAR:  RESPIRATORY: No shortness of breath, cough or sputum.  GASTROINTESTINAL: No anorexia, nausea, vomiting or diarrhea. No abdominal pain or blood.  GENITOURINARY: No burning on urination, no polyuria NEUROLOGICAL: No headache, dizziness, syncope, paralysis, ataxia, numbness or tingling in the extremities. No change in bowel or bladder control.  MUSCULOSKELETAL: No muscle, back pain, joint pain or stiffness.  LYMPHATICS: No enlarged nodes. No  history of splenectomy.  PSYCHIATRIC: No history of depression or anxiety.  ENDOCRINOLOGIC: No reports of sweating, cold or heat intolerance. No polyuria or polydipsia.  Marland Kitchen   Physical Examination There were no vitals filed for this visit. There were no vitals filed for this visit.  Gen: resting comfortably, no acute distress HEENT: no scleral icterus, pupils equal round and reactive, no palptable cervical adenopathy,  CV Resp: Clear to auscultation bilaterally GI: abdomen is soft, non-tender, non-distended, normal bowel sounds, no hepatosplenomegaly MSK: extremities are warm, no edema.  Skin: warm, no  rash Neuro:  no focal deficits Psych: appropriate affect   Diagnostic Studies     Assessment and Plan  1. Palpitations - we will obtain 7 day event monitor - request labs from pcp, if does not include TSH will need to order next visit   F/u 6 weeks.       Antoine Poche, M.D., F.A.C.C.
